# Patient Record
Sex: Female | Born: 1975 | Race: White | Hispanic: No | Marital: Married | Smoking: Never smoker
Health system: Southern US, Community
[De-identification: ages and names within clinical notes are randomized; demographics above are authoritative.]

---

## 2016-04-06 ENCOUNTER — Other Ambulatory Visit: Payer: Self-pay | Admitting: Endocrinology

## 2016-04-06 DIAGNOSIS — E041 Nontoxic single thyroid nodule: Secondary | ICD-10-CM

## 2016-04-09 ENCOUNTER — Ambulatory Visit
Admission: RE | Admit: 2016-04-09 | Discharge: 2016-04-09 | Disposition: A | Discharge status: Home / Self Care | Payer: Managed Care, Other (non HMO) | Source: Ambulatory Visit | Attending: Endocrinology | Admitting: Endocrinology

## 2016-04-09 DIAGNOSIS — E041 Nontoxic single thyroid nodule: Secondary | ICD-10-CM

## 2016-04-14 ENCOUNTER — Other Ambulatory Visit: Payer: Self-pay | Admitting: Endocrinology

## 2016-04-14 DIAGNOSIS — E041 Nontoxic single thyroid nodule: Secondary | ICD-10-CM

## 2016-04-16 ENCOUNTER — Other Ambulatory Visit (HOSPITAL_COMMUNITY)
Admission: RE | Admit: 2016-04-16 | Discharge: 2016-04-16 | Disposition: A | Discharge status: Home / Self Care | Payer: Managed Care, Other (non HMO) | Source: Ambulatory Visit | Attending: Radiology | Admitting: Radiology

## 2016-04-16 ENCOUNTER — Ambulatory Visit
Admission: RE | Admit: 2016-04-16 | Discharge: 2016-04-16 | Disposition: A | Discharge status: Home / Self Care | Payer: Managed Care, Other (non HMO) | Source: Ambulatory Visit | Attending: Endocrinology | Admitting: Endocrinology

## 2016-04-16 DIAGNOSIS — E041 Nontoxic single thyroid nodule: Secondary | ICD-10-CM | POA: Insufficient documentation

## 2017-03-11 ENCOUNTER — Other Ambulatory Visit: Payer: Self-pay | Admitting: Endocrinology

## 2017-03-11 DIAGNOSIS — E041 Nontoxic single thyroid nodule: Secondary | ICD-10-CM

## 2017-03-16 ENCOUNTER — Ambulatory Visit
Admission: RE | Admit: 2017-03-16 | Discharge: 2017-03-16 | Disposition: A | Discharge status: Home / Self Care | Payer: Managed Care, Other (non HMO) | Source: Ambulatory Visit | Attending: Endocrinology | Admitting: Endocrinology

## 2017-03-16 DIAGNOSIS — E041 Nontoxic single thyroid nodule: Secondary | ICD-10-CM

## 2017-03-17 ENCOUNTER — Other Ambulatory Visit: Payer: Managed Care, Other (non HMO)

## 2017-03-18 ENCOUNTER — Other Ambulatory Visit: Payer: Managed Care, Other (non HMO)

## 2017-05-09 DIAGNOSIS — C73 Malignant neoplasm of thyroid gland: Secondary | ICD-10-CM | POA: Insufficient documentation

## 2017-05-16 DIAGNOSIS — Z8042 Family history of malignant neoplasm of prostate: Secondary | ICD-10-CM

## 2017-05-16 DIAGNOSIS — E039 Hypothyroidism, unspecified: Secondary | ICD-10-CM

## 2017-05-16 DIAGNOSIS — C73 Malignant neoplasm of thyroid gland: Secondary | ICD-10-CM | POA: Diagnosis not present

## 2017-05-16 DIAGNOSIS — Z806 Family history of leukemia: Secondary | ICD-10-CM

## 2017-05-16 DIAGNOSIS — Z801 Family history of malignant neoplasm of trachea, bronchus and lung: Secondary | ICD-10-CM | POA: Diagnosis not present

## 2017-05-16 DIAGNOSIS — Z808 Family history of malignant neoplasm of other organs or systems: Secondary | ICD-10-CM

## 2017-05-16 DIAGNOSIS — Z800 Family history of malignant neoplasm of digestive organs: Secondary | ICD-10-CM

## 2017-05-17 ENCOUNTER — Other Ambulatory Visit (HOSPITAL_COMMUNITY): Payer: Self-pay | Admitting: Oncology

## 2017-05-17 DIAGNOSIS — C73 Malignant neoplasm of thyroid gland: Secondary | ICD-10-CM

## 2017-05-25 ENCOUNTER — Encounter (HOSPITAL_COMMUNITY)
Admission: RE | Admit: 2017-05-25 | Discharge: 2017-05-25 | Disposition: A | Discharge status: Home / Self Care | Payer: Managed Care, Other (non HMO) | Source: Ambulatory Visit | Attending: Oncology | Admitting: Oncology

## 2017-05-25 ENCOUNTER — Encounter (HOSPITAL_COMMUNITY): Payer: Self-pay

## 2017-05-25 DIAGNOSIS — C73 Malignant neoplasm of thyroid gland: Secondary | ICD-10-CM | POA: Diagnosis not present

## 2017-05-25 LAB — HCG, SERUM, QUALITATIVE: Preg, Serum: NEGATIVE

## 2017-05-25 MED ORDER — SODIUM IODIDE I 131 CAPSULE
52.3000 | Freq: Once | INTRAVENOUS | Status: AC | PRN
  Administered 2017-05-25: 52.3 via ORAL

## 2017-06-03 ENCOUNTER — Encounter (HOSPITAL_COMMUNITY)
Admission: RE | Admit: 2017-06-03 | Discharge: 2017-06-03 | Disposition: A | Discharge status: Home / Self Care | Payer: Managed Care, Other (non HMO) | Source: Ambulatory Visit | Attending: Oncology | Admitting: Oncology

## 2017-06-03 DIAGNOSIS — C73 Malignant neoplasm of thyroid gland: Secondary | ICD-10-CM | POA: Diagnosis not present

## 2017-11-15 DIAGNOSIS — C73 Malignant neoplasm of thyroid gland: Secondary | ICD-10-CM | POA: Diagnosis not present

## 2017-11-15 DIAGNOSIS — Z79899 Other long term (current) drug therapy: Secondary | ICD-10-CM | POA: Diagnosis not present

## 2018-05-18 DIAGNOSIS — Z79899 Other long term (current) drug therapy: Secondary | ICD-10-CM

## 2018-05-18 DIAGNOSIS — C73 Malignant neoplasm of thyroid gland: Secondary | ICD-10-CM

## 2018-05-30 ENCOUNTER — Other Ambulatory Visit (HOSPITAL_COMMUNITY): Payer: Self-pay | Admitting: Internal Medicine

## 2018-05-30 DIAGNOSIS — C73 Malignant neoplasm of thyroid gland: Secondary | ICD-10-CM

## 2018-06-19 ENCOUNTER — Encounter (HOSPITAL_COMMUNITY)
Admission: RE | Admit: 2018-06-19 | Discharge: 2018-06-19 | Disposition: A | Discharge status: Home / Self Care | Payer: PRIVATE HEALTH INSURANCE | Source: Ambulatory Visit | Attending: Internal Medicine | Admitting: Internal Medicine

## 2018-06-19 DIAGNOSIS — C73 Malignant neoplasm of thyroid gland: Secondary | ICD-10-CM | POA: Insufficient documentation

## 2018-06-19 MED ORDER — STERILE WATER FOR INJECTION IJ SOLN
INTRAMUSCULAR | Status: AC
  Filled 2018-06-19: qty 10

## 2018-06-19 MED ORDER — THYROTROPIN ALFA 1.1 MG IM SOLR
0.9000 mg | INTRAMUSCULAR | Status: AC
  Administered 2018-06-19: 0.9 mg via INTRAMUSCULAR

## 2018-06-20 ENCOUNTER — Encounter (HOSPITAL_COMMUNITY)
Admission: RE | Admit: 2018-06-20 | Discharge: 2018-06-20 | Disposition: A | Discharge status: Home / Self Care | Payer: PRIVATE HEALTH INSURANCE | Source: Ambulatory Visit | Attending: Internal Medicine | Admitting: Internal Medicine

## 2018-06-20 DIAGNOSIS — C73 Malignant neoplasm of thyroid gland: Secondary | ICD-10-CM | POA: Diagnosis not present

## 2018-06-20 MED ORDER — THYROTROPIN ALFA 1.1 MG IM SOLR
0.9000 mg | INTRAMUSCULAR | Status: AC
  Administered 2018-06-20: 0.9 mg via INTRAMUSCULAR

## 2018-06-20 MED ORDER — STERILE WATER FOR INJECTION IJ SOLN
INTRAMUSCULAR | Status: AC
  Filled 2018-06-20: qty 10

## 2018-06-21 ENCOUNTER — Encounter (HOSPITAL_COMMUNITY)
Admission: RE | Admit: 2018-06-21 | Discharge: 2018-06-21 | Disposition: A | Discharge status: Home / Self Care | Payer: PRIVATE HEALTH INSURANCE | Source: Ambulatory Visit | Attending: Internal Medicine | Admitting: Internal Medicine

## 2018-06-21 DIAGNOSIS — C73 Malignant neoplasm of thyroid gland: Secondary | ICD-10-CM | POA: Diagnosis not present

## 2018-06-21 LAB — HCG, SERUM, QUALITATIVE: PREG SERUM: NEGATIVE

## 2018-06-23 ENCOUNTER — Encounter (HOSPITAL_COMMUNITY)
Admission: RE | Admit: 2018-06-23 | Discharge: 2018-06-23 | Disposition: A | Discharge status: Home / Self Care | Payer: PRIVATE HEALTH INSURANCE | Source: Ambulatory Visit | Attending: Internal Medicine | Admitting: Internal Medicine

## 2018-06-23 MED ORDER — SODIUM IODIDE I 131 CAPSULE
4.0000 | Freq: Once | INTRAVENOUS | Status: AC | PRN
  Administered 2018-06-23: 4 via ORAL

## 2019-05-28 DIAGNOSIS — C73 Malignant neoplasm of thyroid gland: Secondary | ICD-10-CM

## 2020-05-27 DIAGNOSIS — C73 Malignant neoplasm of thyroid gland: Secondary | ICD-10-CM | POA: Diagnosis not present

## 2020-06-02 IMAGING — NM NM [ID] THYROID CANCER METS WHOLE BODY W/ THYROGEN
5 series · 5 of 5 positions shown · non-contrast
Comparison: Post therapy scan 06/03/2017

CLINICAL DATA: Papillary thyroid carcinoma. 41-year-old female.
Total thyroidectomy 04/27/2017 demonstrated low risk disease (T2 NX)
remnant ablation without 131 05/25/2017 (52.3 millicuries)

EXAM:
THYROGEN-STIMULATED E-OMO WHOLE BODY SCAN
TECHNIQUE: The patient received 0.9 mg Thyrogen intramuscularly every 24 hours
for two doses. On the third day the patient returned and received
the radiopharmaceutical, per orally. On the fifth day, the patient
returned and whole body planar images were obtained in the anterior
and posterior projections.
RADIOPHARMACEUTICALS:  4.0 mCi E-OMO sodium iodide orally

[Series 1: marker · 4.14mm/px · 1 of 1 slices shown (1 of 2)]
[im 1/1  full-range]
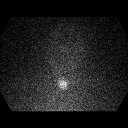

[Series 1: marker · 4.14mm/px · 1 of 1 slices shown (2 of 2)]
[im 1/1]
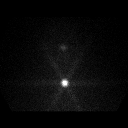

[Series 2: static thyroid no marker · 4.14mm/px · 1 of 1 slices shown]
[im 1/1  full-range]
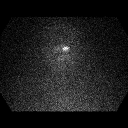

[Series 3: i131 whole body · 2.66mm/px · 1 of 1 slices shown (1 of 2)]
[im 1/1  full-range]
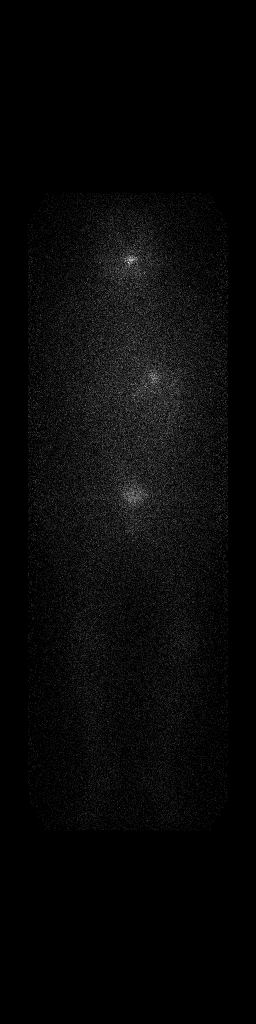

[Series 3: i131 whole body · 2.66mm/px · 1 of 1 slices shown (2 of 2)]
[im 1/1  full-range]
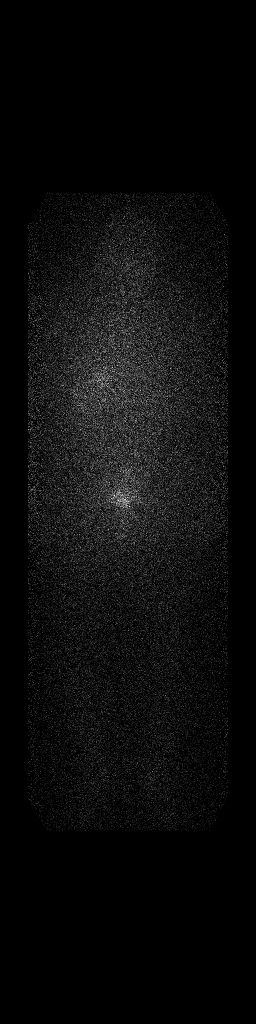

[5 of 5 positions shown; findings below may reference images not displayed]

FINDINGS: No abnormal activity in thyroid bed. Asymmetric uptake in the facial
region is likely related to sinus inflammation. Imaging of the whole
body demonstrates no abnormal radiotracer accumulation. Physiologic
activity noted in the GI and GU tract.
IMPRESSION: No scintigraphic evidence of local thyroid cancer recurrence or
distant metastasis.

## 2021-05-22 ENCOUNTER — Other Ambulatory Visit: Payer: Self-pay | Admitting: Hematology and Oncology

## 2021-05-22 ENCOUNTER — Other Ambulatory Visit: Payer: Self-pay

## 2021-05-22 ENCOUNTER — Encounter: Payer: Self-pay | Admitting: Hematology and Oncology

## 2021-05-22 ENCOUNTER — Inpatient Hospital Stay: Payer: BC Managed Care – PPO | Attending: Oncology

## 2021-05-22 DIAGNOSIS — C73 Malignant neoplasm of thyroid gland: Secondary | ICD-10-CM

## 2021-05-22 LAB — TSH: TSH: 0.024 u[IU]/mL — ABNORMAL LOW (ref 0.350–4.500)

## 2021-05-23 LAB — THYROGLOBULIN ANTIBODY: Thyroglobulin Antibody: <1 IU/mL (ref 0.0–0.9)

## 2021-05-26 NOTE — Progress Notes (Signed)
[facility]  [address] [place],  [place]  [address] [phone]  Clinic Day:  05/28/2021  Referring physician: Reynold Bowen, MD  This document serves as a record of services personally performed by Marice Potter, MD. It was created on their behalf by [facility], a trained medical scribe. The creation of this record is based on the scribe's personal observations and the provider's statements to them.  HISTORY OF PRESENT ILLNESS:  Lori Cline is a 45 y.o. 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  She also went on to receive radioactive iodine ablation in June 2018.  Since then, the patient has been on levothyroxine to maintain thyroid hormone levels in her body without triggering a rise in her TSH level, which has the potential to lead to ectopic, malignant thyroid tissue developing.  She comes in today for routine followup.  Since her last visit, the patient has been doing well.  She denies having any neck pain, fullness or hoarseness which concerns her for disease recurrence.   She does complain of a small knot in her posterior neck that she intermittently feels.  There is no associated redness or swelling in this area.  VITALS:  Blood pressure 124/88, pulse 74, temperature 98.3 F (36.8 C), resp. rate 16, height 5' 2.5" (1.588 m), weight 186 lb 9.6 oz (84.6 kg), SpO2 99 %.  Wt Readings from Last 3 Encounters:  05/28/21 186 lb 9.6 oz (84.6 kg)    Body mass index is 33.59 kg/m.  Performance status (ECOG): 0 - Asymptomatic  PHYSICAL EXAM:  Physical Exam Constitutional:      General: She is not in acute distress.    Appearance: Normal appearance. She is normal weight.  HENT:     Head: Normocephalic and atraumatic.  Eyes:     General: No scleral icterus.    Extraocular Movements: Extraocular movements intact.     Conjunctiva/sclera: Conjunctivae  normal.     Pupils: Pupils are equal, round, and reactive to light.  Cardiovascular:     Rate and Rhythm: Normal rate and regular rhythm.     Pulses: Normal pulses.     Heart sounds: Normal heart sounds. No murmur heard. No friction rub. No gallop.   Pulmonary:     Effort: Pulmonary effort is normal. No respiratory distress.     Breath sounds: Normal breath sounds.  Abdominal:     General: Bowel sounds are normal. There is no distension.     Palpations: Abdomen is soft. There is no hepatomegaly, splenomegaly or mass.     Tenderness: There is no abdominal tenderness.  Musculoskeletal:        General: Normal range of motion.     Cervical back: Normal range of motion and neck supple.     Right lower leg: No edema.     Left lower leg: No edema.  Lymphadenopathy:     Cervical: No cervical adenopathy.  Skin:    General: Skin is warm and dry.  Neurological:     General: No focal deficit present.     Mental Status: She is alert and oriented to person, place, and time. Mental status is at baseline.  Psychiatric:        Mood and Affect: Mood normal.        Behavior: Behavior normal.        Thought Content: Thought content normal.  Judgment: Judgment normal.     LABS:    Ref. 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  Based upon her labs and physical exam today, the patient remains disease free.  There was only a very small nodule palpated in her posterior left neck.  It did not feel like a small, pathologic lymph node or anything else remotely ominous.  Clinically, she is doing very well.  She knows to continue her levothyroxine on a daily basis to help prevent  disease recurrence and to keep her euthyroid.  As she is doing well, I will see her back in another year for repeat clinical assessment.  If her labs and physical exam at her next visit remain normal, it would mark her being 5 years cancer free, for which I would consider her cured of her disease.  The patient understands all the plans discussed today and is in agreement with them.    I, Rita Ohara, am acting as scribe for Marice Potter, MD    I have reviewed this report as typed by the medical scribe, and it is complete and accurate.  Dequincy Macarthur Critchley, MD

## 2021-05-28 ENCOUNTER — Inpatient Hospital Stay: Payer: BC Managed Care – PPO | Attending: Oncology | Admitting: Oncology

## 2021-05-28 ENCOUNTER — Other Ambulatory Visit: Payer: Self-pay

## 2021-05-28 ENCOUNTER — Other Ambulatory Visit: Payer: Self-pay | Admitting: Oncology

## 2021-05-28 ENCOUNTER — Telehealth: Payer: Self-pay | Admitting: Oncology

## 2021-05-28 ENCOUNTER — Encounter: Payer: Self-pay | Admitting: Oncology

## 2021-05-28 VITALS — BP 124/88 | HR 74 | Temp 98.3°F | Resp 16 | Ht 62.5 in | Wt 186.6 lb

## 2021-05-28 DIAGNOSIS — C73 Malignant neoplasm of thyroid gland: Secondary | ICD-10-CM | POA: Diagnosis not present

## 2021-05-28 NOTE — Telephone Encounter (Signed)
Per 6/2 LOS, patient scheduled for May/June 2023 Appt's.  Gave patient Appt Summary

## 2022-05-21 ENCOUNTER — Other Ambulatory Visit: Payer: BC Managed Care – PPO

## 2022-05-26 ENCOUNTER — Inpatient Hospital Stay: Payer: BC Managed Care – PPO | Attending: Oncology

## 2022-05-26 DIAGNOSIS — Z79899 Other long term (current) drug therapy: Secondary | ICD-10-CM | POA: Diagnosis not present

## 2022-05-26 DIAGNOSIS — Z85850 Personal history of malignant neoplasm of thyroid: Secondary | ICD-10-CM | POA: Insufficient documentation

## 2022-05-26 DIAGNOSIS — C73 Malignant neoplasm of thyroid gland: Secondary | ICD-10-CM

## 2022-05-26 LAB — TSH: TSH: 0.099 u[IU]/mL — ABNORMAL LOW (ref 0.350–4.500)

## 2022-05-28 ENCOUNTER — Ambulatory Visit: Payer: BC Managed Care – PPO | Admitting: Oncology

## 2022-05-31 NOTE — Progress Notes (Signed)
[facility]  [address] [place],  [place]  [address] [phone]  Clinic Day:  06/01/2022  Referring physician: Reynold Bowen, MD  HISTORY OF PRESENT ILLNESS:  Lori Cline is a 46 y.o. 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  She also went on to receive radioactive iodine ablation in June 2018.  Since then, the patient has been on levothyroxine to maintain thyroid hormone levels in her body without triggering a rise in her TSH level, which can potentially lead to the development of ectopic, malignant thyroid tissue.  She comes in today for routine followup.  Since her last visit, the patient has been doing well.  She denies having any neck pain, fullness or hoarseness which concerns her for disease recurrence.   VITALS:  Blood pressure 114/83, pulse 76, temperature 98.7 F (37.1 C), temperature source Oral, resp. rate 18, height 5' 2.5" (1.588 m), weight 188 lb 4.8 oz (85.4 kg), SpO2 99 %.  Wt Readings from Last 3 Encounters:  06/01/22 188 lb 4.8 oz (85.4 kg)  05/28/21 186 lb 9.6 oz (84.6 kg)    Body mass index is 33.89 kg/m.  Performance status (ECOG): 0 - Asymptomatic  PHYSICAL EXAM:  Physical Exam Constitutional:      General: She is not in acute distress.    Appearance: Normal appearance. She is normal weight.  HENT:     Head: Normocephalic and atraumatic.  Eyes:     General: No scleral icterus.    Extraocular Movements: Extraocular movements intact.     Conjunctiva/sclera: Conjunctivae normal.     Pupils: Pupils are equal, round, and reactive to light.  Cardiovascular:     Rate and Rhythm: Normal rate and regular rhythm.     Pulses: Normal pulses.     Heart sounds: Normal heart sounds. No murmur heard.   No friction rub. No gallop.  Pulmonary:     Effort: Pulmonary effort is normal. No respiratory distress.     Breath sounds: Normal breath  sounds.  Abdominal:     General: Bowel sounds are normal. There is no distension.     Palpations: Abdomen is soft. There is no hepatomegaly, splenomegaly or mass.     Tenderness: There is no abdominal tenderness.  Musculoskeletal:        General: Normal range of motion.     Cervical back: Normal range of motion and neck supple.     Right lower leg: No edema.     Left lower leg: No edema.  Lymphadenopathy:     Cervical: No cervical adenopathy.  Skin:    General: Skin is warm and dry.  Neurological:     General: No focal deficit present.     Mental Status: She is alert and oriented to person, place, and time. Mental status is at baseline.  Psychiatric:        Mood and Affect: Mood normal.        Behavior: Behavior normal.        Thought Content: Thought content normal.        Judgment: Judgment normal.    LABS:    Latest Reference Range & Units 05/26/22 16:28  TSH 0.350 - 4.500 uIU/mL 0.099 (L)  (L): Data is abnormally low   Latest Reference Range & Units 05/26/22 16:28  Thyroglobulin ng/mL <2.0   ASSESSMENT & PLAN:  A 46 year old woman with a stage I (  T2 N0 M0) follicular variant of papillary thyroid carcinoma, status post a total thyroidectomy in May 2018, followed by radioactive iodine ablation in June 2018.  Based upon her labs and physical exam today, the patient remains disease free.  Her ideal labs and physical exam today make her cancer free for 5 years, for which I now consider her cured of her disease.  Clinically, she is doing very well.  I do feel comfortable turning her care back over to her other physicians.  I would not have a problem seeing her in the future if new hematologic or oncologic issues arise that require repeat clinical assessment.  The patient understands all the plans discussed today and is in agreement with them.  Melvin Marmo Macarthur Critchley, MD

## 2022-06-01 ENCOUNTER — Encounter: Payer: Self-pay | Admitting: Oncology

## 2022-06-01 ENCOUNTER — Inpatient Hospital Stay: Payer: BC Managed Care – PPO | Attending: Oncology | Admitting: Oncology

## 2022-06-01 VITALS — BP 114/83 | HR 76 | Temp 98.7°F | Resp 18 | Ht 62.5 in | Wt 188.3 lb

## 2022-06-01 DIAGNOSIS — C73 Malignant neoplasm of thyroid gland: Secondary | ICD-10-CM | POA: Diagnosis not present

## 2022-06-01 LAB — THYROGLOBULIN LEVEL: Thyroglobulin: <2 ng/mL
